# Patient Record
Sex: Female | Born: 1995
Health system: Southern US, Community
[De-identification: ages and names within clinical notes are randomized; demographics above are authoritative.]

## PROBLEM LIST (undated history)

## (undated) DIAGNOSIS — J45909 Unspecified asthma, uncomplicated: Secondary | ICD-10-CM

## (undated) DIAGNOSIS — J309 Allergic rhinitis, unspecified: Secondary | ICD-10-CM

## (undated) DIAGNOSIS — G40909 Epilepsy, unspecified, not intractable, without status epilepticus: Secondary | ICD-10-CM

## (undated) DIAGNOSIS — L2089 Other atopic dermatitis: Secondary | ICD-10-CM

## (undated) HISTORY — DX: Unspecified asthma, uncomplicated: J45.909

## (undated) HISTORY — DX: Epilepsy, unspecified, not intractable, without status epilepticus: G40.909

## (undated) HISTORY — DX: Other atopic dermatitis: L20.89

## (undated) HISTORY — DX: Allergic rhinitis, unspecified: J30.9

---
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
## 2016-01-14 MED FILL — VYVANSE 40 MG CAPSULE: 40 | 30 days supply | Qty: 30 | Fill #0

## 2016-03-05 DIAGNOSIS — Z3042 Encounter for surveillance of injectable contraceptive: Secondary | ICD-10-CM | POA: Diagnosis not present

## 2016-05-26 DIAGNOSIS — Z3042 Encounter for surveillance of injectable contraceptive: Secondary | ICD-10-CM | POA: Diagnosis not present

## 2016-08-17 DIAGNOSIS — Z3042 Encounter for surveillance of injectable contraceptive: Secondary | ICD-10-CM | POA: Diagnosis not present

## 2016-10-14 ENCOUNTER — Other Ambulatory Visit: Payer: Self-pay | Admitting: Family Medicine

## 2016-10-14 MED FILL — TRIAMCINOLONE 0.1% CREAM: 0.1 | 14 days supply | Qty: 80 | Fill #0

## 2016-11-06 DIAGNOSIS — F419 Anxiety disorder, unspecified: Secondary | ICD-10-CM | POA: Diagnosis not present

## 2016-11-06 DIAGNOSIS — F902 Attention-deficit hyperactivity disorder, combined type: Secondary | ICD-10-CM | POA: Diagnosis not present

## 2016-11-06 DIAGNOSIS — Z79899 Other long term (current) drug therapy: Secondary | ICD-10-CM | POA: Diagnosis not present

## 2016-11-10 MED FILL — VYVANSE 30 MG CAPSULE: 30 | 30 days supply | Qty: 30 | Fill #0

## 2016-11-23 DIAGNOSIS — Z3042 Encounter for surveillance of injectable contraceptive: Secondary | ICD-10-CM | POA: Diagnosis not present

## 2016-12-25 MED FILL — VYVANSE 40 MG CAPSULE: 40 | 30 days supply | Qty: 30 | Fill #0

## 2017-01-13 ENCOUNTER — Encounter: Payer: 59 | Admitting: Family Medicine

## 2017-02-08 ENCOUNTER — Telehealth: Payer: Self-pay | Admitting: Family Medicine

## 2017-02-08 DIAGNOSIS — F902 Attention-deficit hyperactivity disorder, combined type: Secondary | ICD-10-CM | POA: Diagnosis not present

## 2017-02-08 DIAGNOSIS — Z79899 Other long term (current) drug therapy: Secondary | ICD-10-CM | POA: Diagnosis not present

## 2017-02-08 DIAGNOSIS — F419 Anxiety disorder, unspecified: Secondary | ICD-10-CM | POA: Diagnosis not present

## 2017-02-08 NOTE — Telephone Encounter (Signed)
I sent [name] a MyChart message/she needs to schedule an appointment and if [facility] is going [facility] then she needs to be taken care of at that office/if she replies and chooses to see Dr. [place] then an appointment needs to be scheduled/thx dmf

## 2017-02-08 NOTE — Telephone Encounter (Signed)
Copied from CRM [id number]. Topic: Appointment Scheduling - Scheduling Inquiry for Clinic >> Feb 08, 2017  9:47 AM Cecelia ByarsGreen, Temeka L, RMA wrote: Reason for CRM: patient is calling to schedule Depo shot

## 2017-02-10 ENCOUNTER — Ambulatory Visit (INDEPENDENT_AMBULATORY_CARE_PROVIDER_SITE_OTHER): Payer: 59 | Admitting: Physician Assistant

## 2017-02-10 ENCOUNTER — Telehealth: Payer: Self-pay

## 2017-02-10 ENCOUNTER — Encounter: Payer: Self-pay | Admitting: Physician Assistant

## 2017-02-10 ENCOUNTER — Other Ambulatory Visit: Payer: Self-pay

## 2017-02-10 VITALS — BP 121/81 | HR 105 | Temp 99.0°F | Resp 16 | Ht 66.75 in | Wt 186.6 lb

## 2017-02-10 DIAGNOSIS — Z3042 Encounter for surveillance of injectable contraceptive: Secondary | ICD-10-CM

## 2017-02-10 DIAGNOSIS — Z3009 Encounter for other general counseling and advice on contraception: Secondary | ICD-10-CM

## 2017-02-10 MED ORDER — MEDROXYPROGESTERONE ACETATE 150 MG/ML IM SUSY
150.0000 mg | PREFILLED_SYRINGE | Freq: Once | INTRAMUSCULAR | Status: AC
  Administered 2017-02-10: 150 mg via INTRAMUSCULAR

## 2017-02-10 NOTE — Progress Notes (Signed)
[name]  MRN: [id number] DOB: 20-Jul-1995  PCP: Dianne DunAron, Talia M, MD  Subjective:  Pt is a [age] year old female who presents to clinic for birth control management.  Depo shot x 4 years. She is interested in a different method of birth control "because I've read a lot about sie effects" and "the three month thing is annoying". She does not have periods. She has tried pills in the past, she does not like taking pills. 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  She is interested in nexplanon. She is currently in the window to receive Depo injection. She is feeling well today.  Never smoker  Review of Systems  Constitutional: Negative for unexpected weight change.  Gastrointestinal: Negative for nausea and vomiting.  Genitourinary: Negative for menstrual problem, vaginal bleeding, vaginal discharge and vaginal pain.    Patient Active Problem List   Diagnosis Date Noted  . Allergic conjunctivitis and rhinitis 02/11/2015  . ALLERGIC RHINITIS 01/13/2010  . Asthma, chronic 01/13/2010  . Eczematous dermatitis of eyelid 01/13/2010  . SEIZURE DISORDER 01/13/2010    Current Outpatient Medications on File Prior to Visit  Medication Sig Dispense Refill  . albuterol (PROVENTIL HFA;VENTOLIN HFA) 108 (90 BASE) MCG/ACT inhaler Inhale 2 puffs into the lungs every 4 (four) hours as needed. 1 Inhaler 6  . ibuprofen (ADVIL,MOTRIN) 200 MG tablet Take 200 mg by mouth every 6 (six) hours as needed for pain.    Marland Kitchen. lisdexamfetamine (VYVANSE) 40 MG capsule Take 40 mg by mouth every morning.    . triamcinolone cream (KENALOG) 0.1 % APPLY TO THE AFFECTED AREA(S) TWICE DAILY 80 g 0  . EPINEPHrine (EPIPEN 2-PAK) 0.3 mg/0.3 mL IJ SOAJ injection Inject 0.3 mLs (0.3 mg total) into the muscle once. (Patient not taking: Reported on 02/10/2017) 2 Device 1  . fluticasone (FLOVENT HFA) 110 MCG/ACT inhaler Inhale 1 puff into the lungs 2 (two) times daily. Rinse after use (Patient not taking: Reported on  02/10/2017) 1 Inhaler 1  . montelukast (SINGULAIR) 10 MG tablet Take 1 tablet (10 mg total) by mouth at bedtime. (Patient not taking: Reported on 02/10/2017) 30 tablet 3   No current facility-administered medications on file prior to visit.     Allergies  Allergen Reactions  . Other Itching and Swelling    Sesame Seeds.  Throat swells.    . Shellfish Allergy     Reacted to allergy test     Objective:  BP 121/81   Pulse (!) 105   Temp 99 F (37.2 C) (Oral)   Resp 16   Ht 5' 6.75" (1.695 m)   Wt 186 lb 9.6 oz (84.6 kg)   SpO2 100%   BMI 29.45 kg/m   Physical Exam  Constitutional: She is oriented to person, place, and time and well-developed, well-nourished, and in no distress. No distress.  Cardiovascular: Normal rate, regular rhythm and normal heart sounds.  Neurological: She is alert and oriented to person, place, and time. GCS score is 15.  Skin: Skin is warm and dry.  Psychiatric: Mood, memory, affect and judgment normal.  Vitals reviewed.   Assessment and Plan :  1. Encounter for Depo-Provera contraception 2. Encounter for general counseling and advice on contraceptive management - MedroxyPROGESTERone Acetate SUSY 150 mg - Depo injection administered. [facility] against pt's insurance. Plan to sched appt for nexplanon insertion in 3 months. Pt understands and agrees with plan.  Marco CollieWhitney Keslee Harrington, PA-C  [facility] at [facility] 02/10/2017 11:22 AM

## 2017-02-10 NOTE — Telephone Encounter (Signed)
Per message from [facility] McVey,PA-C. Patient is interested in getting a nexplanon birth control implant.  I filled out request on cover my meds and after I know something I will advise Whitney of the outcome.  Reaching a decision could take up to 5 business days. If patient is approved, then we are supposed to schedule her a 3 month appointment with Benny LennertSarah Weber, PA-C for insertion of implant.

## 2017-02-10 NOTE — Patient Instructions (Addendum)
You will receive a phone call from our office to schedule an appointment for implant once we check this against your insurance.  Have a great holiday!   Thank you for coming in today. I hope you feel we met your needs.  Feel free to call PCP if you have any questions or further requests.  Please consider signing up for MyChart if you do not already have it, as this is a great way to communicate with me.  Best,  Mercer Pod, PA-C   Etonogestrel implant What is this medicine? ETONOGESTREL (et oh noe JES trel) is a contraceptive (birth control) device. It is used to prevent pregnancy. It can be used for up to 3 years. This medicine may be used for other purposes; ask your health care provider or pharmacist if you have questions. COMMON BRAND NAME(S): Implanon, Nexplanon What should I tell my health care provider before I take this medicine? They need to know if you have any of these conditions: -abnormal vaginal bleeding -blood vessel disease or blood clots -cancer of the breast, cervix, or liver -depression -diabetes -gallbladder disease -headaches -heart disease or recent heart attack -high blood pressure -high cholesterol -kidney disease -liver disease -renal disease -seizures -tobacco smoker -an unusual or allergic reaction to etonogestrel, other hormones, anesthetics or antiseptics, medicines, foods, dyes, or preservatives -pregnant or trying to get pregnant -breast-feeding How should I use this medicine? This device is inserted just under the skin on the inner side of your upper arm by a health care professional. Talk to your pediatrician regarding the use of this medicine in children. Special care may be needed. Overdosage: If you think you have taken too much of this medicine contact a poison control center or emergency room at once. NOTE: This medicine is only for you. Do not share this medicine with others. What if I miss a dose? This does not apply. What may  interact with this medicine? Do not take this medicine with any of the following medications: -amprenavir -bosentan -fosamprenavir This medicine may also interact with the following medications: -barbiturate medicines for inducing sleep or treating seizures -certain medicines for fungal infections like ketoconazole and itraconazole -grapefruit juice -griseofulvin -medicines to treat seizures like carbamazepine, felbamate, oxcarbazepine, phenytoin, topiramate -modafinil -phenylbutazone -rifampin -rufinamide -some medicines to treat HIV infection like atazanavir, indinavir, lopinavir, nelfinavir, tipranavir, ritonavir -St. John's wort This list may not describe all possible interactions. Give your health care provider a list of all the medicines, herbs, non-prescription drugs, or dietary supplements you use. Also tell them if you smoke, drink alcohol, or use illegal drugs. Some items may interact with your medicine. What should I watch for while using this medicine? This product does not protect you against HIV infection (AIDS) or other sexually transmitted diseases. You should be able to feel the implant by pressing your fingertips over the skin where it was inserted. Contact your doctor if you cannot feel the implant, and use a non-hormonal birth control method (such as condoms) until your doctor confirms that the implant is in place. If you feel that the implant may have broken or become bent while in your arm, contact your healthcare provider. What side effects may I notice from receiving this medicine? Side effects that you should report to your doctor or health care professional as soon as possible: -allergic reactions like skin rash, itching or hives, swelling of the face, lips, or tongue -breast lumps -changes in emotions or moods -depressed mood -heavy or prolonged menstrual bleeding -pain,  irritation, swelling, or bruising at the insertion site -scar at site of insertion -signs  of infection at the insertion site such as fever, and skin redness, pain or discharge -signs of pregnancy -signs and symptoms of a blood clot such as breathing problems; changes in vision; chest pain; severe, sudden headache; pain, swelling, warmth in the leg; trouble speaking; sudden numbness or weakness of the face, arm or leg -signs and symptoms of liver injury like dark yellow or brown urine; general ill feeling or flu-like symptoms; light-colored stools; loss of appetite; nausea; right upper belly pain; unusually weak or tired; yellowing of the eyes or skin -unusual vaginal bleeding, discharge -signs and symptoms of a stroke like changes in vision; confusion; trouble speaking or understanding; severe headaches; sudden numbness or weakness of the face, arm or leg; trouble walking; dizziness; loss of balance or coordination Side effects that usually do not require medical attention (report to your doctor or health care professional if they continue or are bothersome): -acne -back pain -breast pain -changes in weight -dizziness -general ill feeling or flu-like symptoms -headache -irregular menstrual bleeding -nausea -sore throat -vaginal irritation or inflammation This list may not describe all possible side effects. Call your doctor for medical advice about side effects. You may report side effects to FDA at 1-800-FDA-1088. Where should I keep my medicine? This drug is given in a hospital or clinic and will not be stored at home. NOTE: This sheet is a summary. It may not cover all possible information. If you have questions about this medicine, talk to your doctor, pharmacist, or health care provider.  2018 Elsevier/Gold Standard (2015-08-29 11:19:22)  IF you received an x-ray today, you will receive an invoice from [facility]. Please contact [facility] at [phone] with questions or concerns regarding your invoice.   IF you received labwork today, you will  receive an invoice from [facility]. Please contact [facility] at [phone] with questions or concerns regarding your invoice.   Our billing staff will not be able to assist you with questions regarding bills from these companies.  You will be contacted with the lab results as soon as they are available. The fastest way to get your results is to activate your My Chart account. Instructions are located on the last page of this paperwork. If you have not heard from [place] regarding the results in 2 weeks, please contact this office.

## 2017-02-17 NOTE — Telephone Encounter (Signed)
Called pharmacy help desk to check status of Nexplanon PA.  I answered their questions regarding implant, and they told me that we should receive a response today or tomorrow via fax.

## 2017-02-18 NOTE — Telephone Encounter (Signed)
Called pharmacy help desk again today.  They told me that it was still being reviewed.  I told the rep that this has been ongoing since the 17th, and she said she will send a message to the pharmacist to expedite the PA.

## 2017-02-24 NOTE — Telephone Encounter (Signed)
Received notification that patient's nexplanon does not require a PA, it is a covered benefit. Called patient to notify, however, there was no answer and no VM set up.

## 2017-02-24 NOTE — Telephone Encounter (Signed)
Thank you :)

## 2017-03-08 MED FILL — VYVANSE 40 MG CAPSULE: 40 | 30 days supply | Qty: 30 | Fill #0

## 2017-04-26 DIAGNOSIS — F902 Attention-deficit hyperactivity disorder, combined type: Secondary | ICD-10-CM | POA: Diagnosis not present

## 2017-04-26 DIAGNOSIS — Z79899 Other long term (current) drug therapy: Secondary | ICD-10-CM | POA: Diagnosis not present

## 2017-04-26 MED FILL — VYVANSE 40 MG CAPSULE: 40 | 30 days supply | Qty: 30 | Fill #0

## 2017-04-26 MED FILL — VYVANSE 20 MG CAPSULE: 20 | 30 days supply | Qty: 30 | Fill #0

## 2017-04-28 DIAGNOSIS — Z3040 Encounter for surveillance of contraceptives, unspecified: Secondary | ICD-10-CM | POA: Diagnosis not present

## 2017-04-30 DIAGNOSIS — Z3009 Encounter for other general counseling and advice on contraception: Secondary | ICD-10-CM | POA: Diagnosis not present

## 2017-05-03 DIAGNOSIS — Z3009 Encounter for other general counseling and advice on contraception: Secondary | ICD-10-CM | POA: Diagnosis not present

## 2017-06-16 MED FILL — VYVANSE 20 MG CAPSULE: 20 | 30 days supply | Qty: 30 | Fill #0

## 2017-07-22 DIAGNOSIS — Z3042 Encounter for surveillance of injectable contraceptive: Secondary | ICD-10-CM | POA: Diagnosis not present

## 2017-08-06 DIAGNOSIS — Z79899 Other long term (current) drug therapy: Secondary | ICD-10-CM | POA: Diagnosis not present

## 2017-08-06 DIAGNOSIS — F902 Attention-deficit hyperactivity disorder, combined type: Secondary | ICD-10-CM | POA: Diagnosis not present

## 2017-09-15 MED FILL — VYVANSE 20 MG CAPSULE: 20 | 30 days supply | Qty: 30 | Fill #0

## 2017-09-15 MED FILL — VYVANSE 40 MG CAPSULE: 40 | 30 days supply | Qty: 30 | Fill #0

## 2017-10-20 DIAGNOSIS — Z3042 Encounter for surveillance of injectable contraceptive: Secondary | ICD-10-CM | POA: Diagnosis not present

## 2017-11-03 MED FILL — VYVANSE 40 MG CAPSULE: 40 | 30 days supply | Qty: 30 | Fill #0

## 2017-11-03 MED FILL — VYVANSE 20 MG CAPSULE: 20 | 30 days supply | Qty: 30 | Fill #0

## 2017-11-16 DIAGNOSIS — Z79899 Other long term (current) drug therapy: Secondary | ICD-10-CM | POA: Diagnosis not present

## 2017-11-16 DIAGNOSIS — F902 Attention-deficit hyperactivity disorder, combined type: Secondary | ICD-10-CM | POA: Diagnosis not present

## 2017-11-17 DIAGNOSIS — F902 Attention-deficit hyperactivity disorder, combined type: Secondary | ICD-10-CM | POA: Diagnosis not present

## 2017-11-17 DIAGNOSIS — Z79899 Other long term (current) drug therapy: Secondary | ICD-10-CM | POA: Diagnosis not present

## 2018-01-04 MED FILL — VYVANSE 40 MG CAPSULE: 40 | 30 days supply | Qty: 30 | Fill #0

## 2018-01-04 MED FILL — VYVANSE 20 MG CAPSULE: 20 | 30 days supply | Qty: 30 | Fill #0

## 2018-01-12 DIAGNOSIS — Z3042 Encounter for surveillance of injectable contraceptive: Secondary | ICD-10-CM | POA: Diagnosis not present

## 2018-04-09 DIAGNOSIS — Z3042 Encounter for surveillance of injectable contraceptive: Secondary | ICD-10-CM | POA: Diagnosis not present

## 2018-05-02 ENCOUNTER — Other Ambulatory Visit (HOSPITAL_COMMUNITY)
Admission: RE | Admit: 2018-05-02 | Discharge: 2018-05-02 | Disposition: A | Discharge status: Home / Self Care | Payer: 59 | Source: Ambulatory Visit | Attending: Family Medicine | Admitting: Family Medicine

## 2018-05-02 ENCOUNTER — Encounter: Payer: Self-pay | Admitting: Family Medicine

## 2018-05-02 ENCOUNTER — Ambulatory Visit (INDEPENDENT_AMBULATORY_CARE_PROVIDER_SITE_OTHER): Payer: 59 | Admitting: Family Medicine

## 2018-05-02 VITALS — BP 126/68 | HR 99 | Temp 98.2°F | Ht 66.0 in | Wt 199.4 lb

## 2018-05-02 DIAGNOSIS — F988 Other specified behavioral and emotional disorders with onset usually occurring in childhood and adolescence: Secondary | ICD-10-CM

## 2018-05-02 DIAGNOSIS — Z01419 Encounter for gynecological examination (general) (routine) without abnormal findings: Secondary | ICD-10-CM | POA: Diagnosis not present

## 2018-05-02 DIAGNOSIS — Z5181 Encounter for therapeutic drug level monitoring: Secondary | ICD-10-CM

## 2018-05-02 DIAGNOSIS — Z3042 Encounter for surveillance of injectable contraceptive: Secondary | ICD-10-CM

## 2018-05-02 DIAGNOSIS — Z79899 Other long term (current) drug therapy: Secondary | ICD-10-CM

## 2018-05-02 DIAGNOSIS — Z124 Encounter for screening for malignant neoplasm of cervix: Secondary | ICD-10-CM | POA: Insufficient documentation

## 2018-05-02 DIAGNOSIS — Z309 Encounter for contraceptive management, unspecified: Secondary | ICD-10-CM | POA: Insufficient documentation

## 2018-05-02 DIAGNOSIS — Z113 Encounter for screening for infections with a predominantly sexual mode of transmission: Secondary | ICD-10-CM | POA: Diagnosis not present

## 2018-05-02 MED ORDER — ALBUTEROL SULFATE HFA 108 (90 BASE) MCG/ACT IN AERS: 2.0000 | INHALATION_SPRAY | RESPIRATORY_TRACT | 6 refills | Status: AC | PRN

## 2018-05-02 MED ORDER — LISDEXAMFETAMINE DIMESYLATE 40 MG PO CAPS: 40.0000 mg | ORAL_CAPSULE | ORAL | 0 refills | Status: DC

## 2018-05-02 MED ORDER — TRIAMCINOLONE ACETONIDE 0.1 % EX CREA: TOPICAL_CREAM | CUTANEOUS | 0 refills | Status: AC

## 2018-05-02 MED FILL — VYVANSE 40 MG CAPSULE: 40 | 30 days supply | Qty: 30 | Fill #0

## 2018-05-02 MED FILL — TRIAMCINOLONE 0.1% CREAM: 0.1 | 14 days supply | Qty: 80 | Fill #0

## 2018-05-02 MED FILL — VENTOLIN HFA 90 MCG INHALER: 108 (90 BAS | 17 days supply | Qty: 18 | Fill #0

## 2018-05-02 NOTE — Patient Instructions (Signed)
Great to see you. I will call you with your results from today and you can view them online.   

## 2018-05-02 NOTE — Progress Notes (Signed)
Subjective:   Patient ID: [name], female    DOB: Dec 24, 1995, 23 y.o.   MRN: [id number]  [name] is a pleasant 23 y.o. year old female who presents to clinic today with Annual Exam (Patient is here today for a CPE with PAP/Breast exam, Rx Refill )  on 05/02/2018 Annual Exam (Patient is here today for a CPE with PAP/Breast exam, Rx Refill )  on 05/02/2018  HPI:  Here for CPX, pap smear, STD screening and to discuss ADD management.  I have not seen her in years as she is now living in [place], [place] and going to nursing school.  On IM depo provera every 3 months for contraception.  She is sexually active with her boyfriend of 2 and a 1/2 years.    Health Maintenance  Topic Date Due  . PAP-Cervical Cytology Screening  09/10/2017  . PAP SMEAR-Modifier  09/10/2017  . TETANUS/TDAP  09/10/2027  . INFLUENZA VACCINE  Completed  . HIV Screening  Completed    ADD- formally diagnosed through psych eval.  Has been managed by Maris[facility]  [facility] with Vyvanse 20 mg daily.  She does want me to take over refilling this. Last filled rx on 01/04/18- PDMP reviewed.    Current Outpatient Medications on File Prior to Visit  Medication Sig Dispense Refill  . EPINEPHrine (EPIPEN 2-PAK) 0.3 mg/0.3 mL IJ SOAJ injection Inject 0.3 mLs (0.3 mg total) into the muscle once. 2 Device 1  . medroxyPROGESTERone (DEPO-PROVERA) 150 MG/ML injection ADM 1 ML IM Q 3 MONTHS     No current facility-administered medications on file prior to visit.     Allergies  Allergen Reactions  . Other Itching and Swelling    Sesame Seeds.  Throat swells.    . Shellfish Allergy     Reacted to allergy test    Past Medical History:  Diagnosis Date  . Allergic rhinitis   . Other atopic dermatitis and related conditions   . Seizure disorder (HCC)    at age 14  . Unspecified asthma(493.90)     Past Surgical History:  Procedure Laterality Date  . ANTERIOR CRUCIATE LIGAMENT REPAIR Left 11/17/2012   Procedure: LEFT KNEE ARTHROSCOPY WITH AUTOGRAFT ANTERIOR CRUCIATE LIGAMENT (ACL)  RECONSTRUCTION, POSSIBLE PARTIAL MENISECTOMY VS REPAIR;  Surgeon: Senaida Lange, MD;  Location: [facility] OR;  Service: Orthopedics;  Laterality: Left;    Family History  Problem Relation Age of Onset  . Cancer Maternal Aunt   . Asthma Paternal Aunt   . Hypertension Paternal Aunt   . Hypertension Paternal Uncle   . Asthma Maternal Grandmother   . COPD Maternal Grandmother   . Cancer Maternal Grandmother   . Diabetes Maternal Grandmother   . COPD Maternal Grandfather     Social History   Socioeconomic History  . Marital status: Single    Spouse name: Not on file  . Number of children: Not on file  . Years of education: Not on file  . Highest education level: Not on file  Occupational History  . Not on file  Social Needs  . Financial resource strain: Not on file  . Food insecurity:    Worry: Not on file    Inability: Not on file  . Transportation needs:    Medical: Not on file    Non-medical: Not on file  Tobacco Use  . Smoking status: Never Smoker  . Smokeless tobacco: Never Used  Substance and Sexual Activity  . Alcohol use: No  . Drug use: No  . Sexual activity: Yes    Birth control/protection: Injection  Lifestyle  . Physical activity:    Days per week: Not on file    Minutes per session: Not on file  . Stress: Not on file  Relationships  . Social connections:    Talks on phone: Not on file    Gets together: Not on file    Attends religious service: Not on file    Active member of club or organization: Not on file    Attends meetings of clubs or organizations: Not on file    Relationship status: Not on file  . Intimate partner violence:    Fear of current or ex partner: Not on file    Emotionally abused: Not on file    Physically abused: Not on file    Forced sexual activity: Not on file  Other Topics Concern  . Not on file  Social History Narrative   Freshman in college.   Loves horses.   The PMH, PSH, Social History, Family History, Medications, and  allergies have been reviewed in [facility], and have been updated if relevant.  Review of Systems  Constitutional: Negative.   HENT: Negative.   Eyes: Negative.   Cardiovascular: Negative.   Gastrointestinal: Negative.   Endocrine: Negative.   Genitourinary: Negative.   Musculoskeletal: Negative.   Allergic/Immunologic: Negative.   Neurological: Negative.   Hematological: Negative.   Psychiatric/Behavioral: Negative.   All other systems reviewed and are negative.      Objective:    BP 126/68   Pulse 99   Temp 98.2 F (36.8 C)   Ht 5\' 6"  (1.676 m)   Wt 199 lb 6.4 oz (90.4 kg)   SpO2 99%   BMI 32.18 kg/m    Physical Exam   General:  Well-developed,well-nourished,in no acute distress; alert,appropriate and cooperative throughout examination Head:  normocephalic and atraumatic.   Eyes:  vision grossly intact, PERRL Ears:  R ear normal and L ear normal externally, TMs clear bilaterally Nose:  no external deformity.   Mouth:  good dentition.   Neck:  No deformities, masses, or tenderness noted. Breasts:  No mass, nodules, thickening, tenderness, bulging, retraction, inflamation, nipple discharge or skin changes noted.   Lungs:  Normal respiratory effort, chest expands symmetrically. Lungs are clear to auscultation, no crackles or wheezes. Heart:  Normal rate and regular rhythm. S1 and S2 normal without gallop, murmur, click, rub or other extra sounds. Abdomen:  Bowel sounds positive,abdomen soft and non-tender without masses, organomegaly or hernias noted. Rectal:  no external abnormalities.   Genitalia:  Pelvic Exam:        External: normal female genitalia without lesions or masses        Vagina: normal without lesions or masses        Cervix: normal without lesions or masses        Adnexa: normal bimanual exam without masses or fullness        Uterus: normal by palpation        Pap smear: performed Msk:  No deformity or scoliosis noted of thoracic or lumbar spine.     Extremities:  No clubbing, cyanosis, edema, or deformity noted with normal full range of motion of all joints.   Neurologic:  alert & oriented X3 and gait normal.   Skin:  Intact without suspicious lesions or rashes Cervical Nodes:  No lymphadenopathy noted Axillary Nodes:  No palpable lymphadenopathy Psych:  Cognition and judgment appear intact. Alert and cooperative with normal attention span and concentration. No apparent delusions, illusions, hallucinations  Assessment & Plan:   Encounter for monitoring stimulant therapy - Plan: Pain Mgmt, Profile 8 w/Conf, U  Well woman exam with routine gynecological exam - Plan: Cytology - PAP( [facility])  Attention deficit disorder, unspecified hyperactivity presence  Encounter for surveillance of injectable contraceptive  Screening examination for STD (sexually transmitted disease) - Plan: HIV Antibody (routine testing w rflx), RPR  Screening for cervical cancer - Plan: Cervicovaginal ancillary only( [facility]) No follow-ups on file.

## 2018-05-02 NOTE — Assessment & Plan Note (Signed)
CCS reviewed, signed by patient today. UDS today. PDMP reviewed- no red flags. Vyvanse Rx refilled. The patient indicates understanding of these issues and agrees with the plan.

## 2018-05-02 NOTE — Assessment & Plan Note (Signed)
Reviewed preventive care protocols, scheduled due services, and updated immunizations Discussed nutrition, exercise, diet, and healthy lifestyle.  Pap smear done today. 

## 2018-05-03 ENCOUNTER — Encounter: Payer: Self-pay | Admitting: Family Medicine

## 2018-05-03 LAB — PAIN MGMT, PROFILE 8 W/CONF, U
6 Acetylmorphine: NEGATIVE ng/mL (ref <10)
Alcohol Metabolites: NEGATIVE ng/mL (ref <500)
Amphetamines: NEGATIVE ng/mL (ref <500)
Benzodiazepines: NEGATIVE ng/mL (ref <100)
Buprenorphine, Urine: NEGATIVE ng/mL (ref <5)
CREATININE: 183.7 mg/dL
Cocaine Metabolite: NEGATIVE ng/mL (ref <150)
MDMA: NEGATIVE ng/mL (ref <500)
Marijuana Metabolite: NEGATIVE ng/mL (ref <20)
Opiates: NEGATIVE ng/mL (ref <100)
Oxidant: NEGATIVE ug/mL (ref <200)
Oxycodone: NEGATIVE ng/mL (ref <100)
PH: 6.67 (ref 4.5–9.0)

## 2018-05-03 LAB — HIV ANTIBODY (ROUTINE TESTING W REFLEX): HIV 1&2 Ab, 4th Generation: NONREACTIVE

## 2018-05-03 LAB — CYTOLOGY - PAP: Diagnosis: NEGATIVE

## 2018-05-03 LAB — RPR: RPR Ser Ql: NONREACTIVE

## 2018-05-03 LAB — CERVICOVAGINAL ANCILLARY ONLY
Bacterial vaginitis: POSITIVE — AB
Candida vaginitis: NEGATIVE
Chlamydia: NEGATIVE
Neisseria Gonorrhea: NEGATIVE
Trichomonas: NEGATIVE

## 2018-05-04 ENCOUNTER — Telehealth: Payer: Self-pay

## 2018-05-04 MED ORDER — METRONIDAZOLE 500 MG PO TABS: 500.0000 mg | ORAL_TABLET | Freq: Two times a day (BID) | ORAL | 0 refills | Status: AC

## 2018-05-04 MED FILL — metroNIDAZOLE 500 MG TABS: 500 | 7 days supply | Qty: 14 | Fill #0

## 2018-05-04 NOTE — Telephone Encounter (Signed)
Pt aware of lab results/agrees with plan/sent in Flagyl bid #14/thx dmf

## 2018-05-04 NOTE — Telephone Encounter (Signed)
-----   Message from Dianne Dun, MD sent at 05/03/2018  5:03 PM EDT ----- Please call pt- negative for STDs, including trichomonas, chlamydia, gonorrhea, but she did test positive for bacterial vaginosis (not a sexually transmitted disease). Please verify drug allergies and pharmacy and then send in flagyl 500 mg twice daily x 7 days, #14 with no refills.

## 2018-06-05 ENCOUNTER — Other Ambulatory Visit: Payer: Self-pay | Admitting: Family Medicine

## 2018-06-06 MED ORDER — LISDEXAMFETAMINE DIMESYLATE 40 MG PO CAPS: 40.0000 mg | ORAL_CAPSULE | ORAL | 0 refills | Status: DC

## 2018-06-08 ENCOUNTER — Encounter: Payer: Self-pay | Admitting: Family Medicine

## 2018-06-10 ENCOUNTER — Other Ambulatory Visit: Payer: Self-pay

## 2018-06-10 MED ORDER — LISDEXAMFETAMINE DIMESYLATE 40 MG PO CAPS: 40.0000 mg | ORAL_CAPSULE | ORAL | 0 refills | Status: DC

## 2018-06-10 MED ORDER — LISDEXAMFETAMINE DIMESYLATE 40 MG PO CAPS: 40.0000 mg | ORAL_CAPSULE | ORAL | 0 refills | Status: AC

## 2018-06-10 NOTE — Telephone Encounter (Signed)
TA-LOV 3.9.20/LF: 3.9.20/NOV due in Sept/CSC & UDS are UTD/Per [place] PMP pt compliant no red flag/prepared April, May, June and pended to go to [facility] as [facility] closed/thx dmf

## 2018-07-06 DIAGNOSIS — Z3042 Encounter for surveillance of injectable contraceptive: Secondary | ICD-10-CM | POA: Diagnosis not present

## 2018-07-27 ENCOUNTER — Other Ambulatory Visit: Payer: Self-pay | Admitting: Family Medicine

## 2018-07-28 MED ORDER — LISDEXAMFETAMINE DIMESYLATE 40 MG PO CAPS: 40.0000 mg | ORAL_CAPSULE | ORAL | 0 refills | Status: AC

## 2018-07-28 MED FILL — VYVANSE 40 MG CAPSULE: 40 | 30 days supply | Qty: 30 | Fill #0

## 2018-09-02 MED FILL — VYVANSE 40 MG CAPSULE: 40 | 30 days supply | Qty: 30 | Fill #0

## 2018-10-05 DIAGNOSIS — Z3042 Encounter for surveillance of injectable contraceptive: Secondary | ICD-10-CM | POA: Diagnosis not present

## 2018-12-31 DIAGNOSIS — Z3042 Encounter for surveillance of injectable contraceptive: Secondary | ICD-10-CM | POA: Diagnosis not present

## 2019-03-28 DIAGNOSIS — Z3042 Encounter for surveillance of injectable contraceptive: Secondary | ICD-10-CM | POA: Diagnosis not present

## 2019-06-24 DIAGNOSIS — Z3042 Encounter for surveillance of injectable contraceptive: Secondary | ICD-10-CM | POA: Diagnosis not present

## 2019-09-21 DIAGNOSIS — Z3042 Encounter for surveillance of injectable contraceptive: Secondary | ICD-10-CM | POA: Diagnosis not present
